# Patient Record
Sex: Male | Born: 1960
Health system: Western US, Academic
[De-identification: ages and names within clinical notes are randomized; demographics above are authoritative.]

---

## 2017-11-29 ENCOUNTER — Ambulatory Visit

## 2017-11-29 DIAGNOSIS — R002 Palpitations: Secondary | ICD-10-CM

## 2017-11-29 DIAGNOSIS — Z136 Encounter for screening for cardiovascular disorders: Secondary | ICD-10-CM

## 2017-11-29 NOTE — Progress Notes
OUTPATIENT INTERVENTIONAL CARDIOVASCULAR CONSULTATION NOTE   PATIENT: Daniel Montoya  MRN: [id number]  DOB: February 28, 1961  DATE OF SERVICE: 11/29/2017    REFERRING PRACTITIONER: No ref. provider found  PRIMARY CARE PROVIDER: No primary care provider on file.  REASON FOR CONSULT: palpitation    History of Present Illness:   Niguel Moure is a 57 y.o. male who I am seeing for  evaluation of palpitation.  - for last several weeks- has been having intermittent episodes of palpitation that gets worse throughout the day and he goes to bed like that and wakes up ok in the morining.- gets some SOB and dizziness.  - father died of MI  - denies any prior cardiac diz.  Coronary artery disease risk factors include: hypertension and male gender.   Patient denies exertional chest pressure/discomfort, paroxysmal nocturnal dyspnea, syncope and tachypnea.      Past Medical History:      No past medical history on file.  All were reviewed personally .  Past Surgical History:   No past surgical history on file.  All were reviewed personally .  Social History:     Social History     Social History   ??? Marital status: Married     Spouse name: N/A   ??? Number of children: N/A   ??? Years of education: N/A     Social History Main Topics   ??? Smoking status: Former Smoker   ??? Smokeless tobacco: Never Used   ??? Alcohol use None   ??? Drug use: Unknown   ??? Sexual activity: Not Asked     Other Topics Concern   ??? None     Social History Narrative   ??? None     All were reviewed personally .  Family History:   No family history on file.  All were reviewed personally .  Allergies:   Not on File  Home Medications:     No current outpatient prescriptions on file.     No current facility-administered medications for this visit.            Review of Systems:    14 points  comprehensive review of system  was performed and is negative except those mentioned in HPI.    Physical Exam:   VITALS: BP 142/83  ~ Pulse 77  ~ Ht 6' (1.829 m)  ~ Wt 184 lb 6.4 oz (83.6 kg)  ~ SpO2 98%  ~ BMI 25.01 kg/m???    Body mass index is 25.01 kg/m???.  General: alert, appears stated age and cooperative  Skin: no rashes  Eyes: conjunctivae/corneas clear. PERRL, EOM's intact.   HEET: atrauamtic with moist mucous membranes  Neck: Normal, JVP is not elevated  Respiratory: respiratory effort is normal, clear to auscultation bilaterally  Heart: regular rate and rhythm, S1, S2 normal, no murmur, click, rub or gallop  Abdomen: soft, non-tender; bowel sounds normal; no masses,  no organomegaly  Extremities: extremities normal, atraumatic, no cyanosis or edema  Pulses: 2+ and symmetric  Bruits: No carotid bruits  Neurological/Psychiatric: oriented x 3, normal mood and affect, normal judgment and insight    Laboratory Data:       No results found for: NA, K, CL, CO2, BUN, CREAT, CALCIUM, MG  No results found for: CHOL, CHOLHDL, CHOLDLCAL, CHOLDLQ, TRIGLY  @(LASTA1C)@ No results found for: WBC, HGB, HCT, MCV, PLT, INR, APTT  No results found for: AST, ALT, ALKPHOS, BILITOT, ALBUMIN  No results found for: BNP, TSH, T4AUTO, HGBA1C, HSCRP  No results found for: WBC, HGB, HCT, MCV, PLT  No results found for: NA, K, CL, CO2, BUN, CREAT, CALCIUM, MG  No results found for: AST, ALT, ALKPHOS, BILITOT, ALBUMIN  No results found for: CHOL, CHOLHDL, CHOLDLCAL, CHOLDLQ, TRIGLY  No results found for: PT, INR  No results found for: HGBA1C  No results found for: TSH, T4AUTO, T3AUTO  No results found for: BNP   No results found for: HSCRP    No components found for: HGA1C  @(LASTA1C)@  No components found for: HA1C    Cannot calculate ASCVD risk because patient met exclusionary criteria (HDL and Total Cholesterol) as of 4:13 PM on 11/29/2017  Values used to calculate ASCVD score:  Age: 57 y.o.   Gender: Male Race: Not African American.  Cannot calculate risk because HDL cholesterol not documented within the past 5 years.    Cannot calculate risk because Total cholesterol not documented within the past 5 years. Systolic BP: 142 mm Hg. BP was measured today.  The patient is not being treated with a medication that influences SBP.  The patient is currently not a smoker.  The patient does not have a diagnosis of diabetes.    Diagnostic/Imaging Studies:     ECG independently reviewed by me:  Normal Sinus rhythm Normal ECG    Echocardiogram independently reviewed by me: not done     Other tests:  none     Imaging  independently reviewed by me:        Assessment :   Zarian Colpitts is a 57 y.o. male with:    Palpitation/heart racing  Elevated BP       Plan:     - will get an echo to assess cardiac structure and valvular function.  - will get a Holter/Event monitor to assess for cardiac dysrrhythmia.  - advised the pt to start home BP monitoring and keeping a log.    I spent a total of 30 minutes face to face with the patient of which greater than 50% of that time was spent in counseling/coordination.  Topics of my discussion are in my note.          Current plan of care was discussed in detail with the patient and all questions were answered.     Lamarr Lulas MD    Assistant Clinical Professor   Interventional Cardiology  [employer] at [facility]    11/29/2017  4:13 PM

## 2017-12-12 ENCOUNTER — Telehealth

## 2017-12-12 NOTE — Telephone Encounter
Called l/m for patient to call back to schedule zio patch.

## 2017-12-21 ENCOUNTER — Ambulatory Visit: Payer: BLUE CROSS/BLUE SHIELD

## 2017-12-21 DIAGNOSIS — R002 Palpitations: Secondary | ICD-10-CM

## 2017-12-21 NOTE — Interdisciplinary
Patient will call to schedule Zio unable to do it at this time.

## 2017-12-21 NOTE — Progress Notes
OUTPATIENT INTERVENTIONAL CARDIOVASCULAR CONSULTATION NOTE   PATIENT: Daniel Montoya  MRN: [id number]  DOB: 01/30/1961  DATE OF SERVICE: 12/21/2017    REFERRING PRACTITIONER: No ref. provider found  PRIMARY CARE PROVIDER: No primary care provider on file.  REASON FOR CONSULT: palpitation    History of Present Illness:   Daniel Montoya is a 57 y.o. male who I am seeing for  evaluation of palpitation.  - for last several weeks- has been having intermittent episodes of palpitation that gets worse throughout the day and he goes to bed like that and wakes up ok in the morining.- gets some SOB and dizziness.  - father died of MI  - denies any prior cardiac diz.  Coronary artery disease risk factors include: hypertension and male gender.   Patient denies exertional chest pressure/discomfort, paroxysmal nocturnal dyspnea, syncope and tachypnea.    Interval history:  12/21/2017  - returned form vacation- did nothave major sx   - did not monitor BP at home    Past Medical History:      No past medical history on file.  All were reviewed personally .  Past Surgical History:   No past surgical history on file.  All were reviewed personally .  Social History:     Social History     Social History   ??? Marital status: Married     Spouse name: N/A   ??? Number of children: N/A   ??? Years of education: N/A     Social History Main Topics   ??? Smoking status: Former Smoker   ??? Smokeless tobacco: Never Used   ??? Alcohol use None   ??? Drug use: Unknown   ??? Sexual activity: Not Asked     Other Topics Concern   ??? None     Social History Narrative   ??? None     All were reviewed personally .  Family History:   No family history on file.  All were reviewed personally .  Allergies:   Not on File  Home Medications:     No current outpatient prescriptions on file.     No current facility-administered medications for this visit.            Review of Systems:    14 points  comprehensive review of system  was performed and is negative except those mentioned in HPI.    Physical Exam:   VITALS: BP 142/86  ~ Pulse 79  ~ Ht 6' (1.829 m)  ~ Wt 186 lb (84.4 kg)  ~ BMI 25.23 kg/m???    Body mass index is 25.23 kg/m???.  General: alert, appears stated age and cooperative  Skin: no rashes  Eyes: conjunctivae/corneas clear. PERRL, EOM's intact.   HEET: atrauamtic with moist mucous membranes  Neck: Normal, JVP is not elevated  Respiratory: respiratory effort is normal, clear to auscultation bilaterally  Heart: regular rate and rhythm, S1, S2 normal, no murmur, click, rub or gallop  Abdomen: soft, non-tender; bowel sounds normal; no masses,  no organomegaly  Extremities: extremities normal, atraumatic, no cyanosis or edema  Pulses: 2+ and symmetric  Bruits: No carotid bruits  Neurological/Psychiatric: oriented x 3, normal mood and affect, normal judgment and insight    Laboratory Data:       No results found for: NA, K, CL, CO2, BUN, CREAT, CALCIUM, MG  No results found for: CHOL, CHOLHDL, CHOLDLCAL, CHOLDLQ, TRIGLY  @(LASTA1C)@ No results found for: WBC, HGB, HCT, MCV, PLT, INR, APTT  No results  found for: AST, ALT, ALKPHOS, BILITOT, ALBUMIN  No results found for: BNP, TSH, T4AUTO, HGBA1C, HSCRP     No results found for: WBC, HGB, HCT, MCV, PLT  No results found for: NA, K, CL, CO2, BUN, CREAT, CALCIUM, MG  No results found for: AST, ALT, ALKPHOS, BILITOT, ALBUMIN  No results found for: CHOL, CHOLHDL, CHOLDLCAL, CHOLDLQ, TRIGLY  No results found for: PT, INR  No results found for: HGBA1C  No results found for: TSH, T4AUTO, T3AUTO  No results found for: BNP   No results found for: HSCRP    No components found for: HGA1C  @(LASTA1C)@  No components found for: HA1C    Cannot calculate ASCVD risk because patient met exclusionary criteria (HDL and Total Cholesterol) as of 9:03 AM on 12/21/2017  Values used to calculate ASCVD score:  Age: 57 y.o.   Gender: Male Race: Not African American.  Cannot calculate risk because HDL cholesterol not documented within the past 5 years.    Cannot calculate risk because Total cholesterol not documented within the past 5 years.    Systolic BP: 142 mm Hg. BP was measured today.  The patient is not being treated with a medication that influences SBP.  The patient is currently not a smoker.  The patient does not have a diagnosis of diabetes.    Diagnostic/Imaging Studies:     ECG independently reviewed by me:  Normal Sinus rhythm Normal ECG    Echocardiogram independently reviewed by me: normal     Other tests:  none     Imaging  independently reviewed by me:        Assessment :   Daniel Montoya is a 57 y.o. male with:    Palpitation/heart racing  Elevated BP       Plan:     Echo findings were discussed- pt was reassured that has structurally normal heart.  - will get a Holter/Event monitor to assess for cardiac dysrrhythmia.  - advised the pt to start home BP monitoring and keeping a log.    I spent a total of 10 minutes face to face with the patient of which greater than 50% of that time was spent in counseling/coordination.  Topics of my discussion are in my note.          Current plan of care was discussed in detail with the patient and all questions were answered.     Lamarr Lulas MD    Assistant Clinical Professor   Interventional Cardiology  [employer] at [facility]    12/21/2017  9:03 AM

## 2018-05-02 ENCOUNTER — Telehealth: Payer: BLUE CROSS/BLUE SHIELD

## 2018-05-02 ENCOUNTER — Ambulatory Visit: Payer: BLUE CROSS/BLUE SHIELD

## 2018-05-02 ENCOUNTER — Institutional Professional Consult (permissible substitution): Payer: BLUE CROSS/BLUE SHIELD

## 2018-05-02 DIAGNOSIS — R000 Tachycardia, unspecified: Secondary | ICD-10-CM

## 2018-05-02 DIAGNOSIS — R002 Palpitations: Secondary | ICD-10-CM

## 2018-05-02 NOTE — Interdisciplinary
It has been explained to the patient that they may receive a bill for the portion they are responsible for if their insurance does not cover the entire cost of the patch. It has been seen that the average coverage for the zio patch is around 80%, leaving the remaining balance between $50-$80 as patients responsibility. This is just an estimated value and patient has been informed that by calling zio patch directly at [phone], they would be able to get a more accurate estimate depending on their insurance coverage.  Patient has agreed to continue with zio patch placement.  Zio Patch applied per Dr. Patricia Pesa Orders for 14 days. Skin prepped and cleaned thoroughly. Device applied and pressed firmly across the entire device for 2 minutes. Zio button pressed and light is flashing green. Patient made aware that if device falls off, please call customer service [phone]. Patient made aware that they may take a shower, but keep it short and keep soap and lotion away. Please do not submerge device in water, such as bath or swimming. Patient made aware to please press button if experiencing any symptoms and please document in the diary the time, date, what they felt, duration, and activity performed while felt. Explained to patient at the end of the 14 days, to please remove device and place in pre-paid box and send to via USPS or and (blue) mailbox. Patient made aware of the option to download the application for journal keeping of cardiac events. Patient made to allow 1-2 weeks for the company to receive and download and please F/U with provider.   Device #: [id number]  Date Placed: 05/02/2018  Time Placed: 10:32 AM

## 2018-05-02 NOTE — Progress Notes
OUTPATIENT INTERVENTIONAL CARDIOVASCULAR CONSULTATION NOTE   PATIENT: Daniel Montoya  MRN: [id number]  DOB: [birth year]  DATE OF SERVICE: 05/02/2018    REFERRING PRACTITIONER: Lamarr Lulas, MD, M*  PRIMARY CARE PROVIDER: Merlyn Lot, MD  REASON FOR CONSULT: palpitation    History of Present Illness:   Daniel Montoya is a 58 y.o. male who I am seeing for  evaluation of palpitation.  - for last several weeks- has been having intermittent episodes of palpitation that gets worse throughout the day and he goes to bed like that and wakes up ok in the morining.- gets some SOB and dizziness.  - father died of MI  - denies any prior cardiac diz.  Coronary artery disease risk factors include: hypertension and male gender.   Patient denies exertional chest pressure/discomfort, paroxysmal nocturnal dyspnea, syncope and tachypnea.    Interval history:  05/02/2018   - has been having postural dizziness and heart is racing  - denies any exertional sx  - under stress- job related  10/4  - returned form vacation- did nothave major sx   - did not monitor BP at home    Past Medical History:      History reviewed. No pertinent past medical history.  All were reviewed personally .  Past Surgical History:   History reviewed. No pertinent surgical history.  All were reviewed personally .  Social History:     Social History     Socioeconomic History   ??? Marital status: Married     Spouse name: Not on file   ??? Number of children: Not on file   ??? Years of education: Not on file   ??? Highest education level: Not on file   Occupational History   ??? Not on file   Social Needs   ??? Financial resource strain: Not on file   ??? Food insecurity:     Worry: Not on file     Inability: Not on file   ??? Transportation needs:     Medical: Not on file     Non-medical: Not on file   Tobacco Use   ??? Smoking status: Former Smoker   ??? Smokeless tobacco: Never Used   Substance and Sexual Activity   ??? Alcohol use: Not on file   ??? Drug use: Not on file ??? Sexual activity: Not on file   Lifestyle   ??? Physical activity:     Days per week: Not on file     Minutes per session: Not on file   ??? Stress: Not on file   Relationships   ??? Social connections:     Talks on phone: Not on file     Gets together: Not on file     Attends religious service: Not on file     Active member of club or organization: Not on file     Attends meetings of clubs or organizations: Not on file     Relationship status: Not on file   Other Topics Concern   ??? Not on file   Social History Narrative   ??? Not on file     All were reviewed personally .  Family History:   History reviewed. No pertinent family history.  All were reviewed personally .  Allergies:   Not on File  Home Medications:     No current outpatient medications on file.     No current facility-administered medications for this visit.  Review of Systems:    14 points  comprehensive review of system  was performed and is negative except those mentioned in HPI.    Physical Exam:   VITALS: BP 110/69 (BP Location: Left arm, Patient Position: Standing, Cuff Size: Regular)  ~ Pulse 89  ~ Ht 6' (1.829 m)  ~ Wt 192 lb 12.8 oz (87.5 kg)  ~ BMI 26.15 kg/m???    Body mass index is 26.15 kg/m???.  General: alert, appears stated age and cooperative  Skin: no rashes  Eyes: conjunctivae/corneas clear. PERRL, EOM's intact.   HEET: atrauamtic with moist mucous membranes  Neck: Normal, JVP is not elevated  Respiratory: respiratory effort is normal, clear to auscultation bilaterally  Heart: regular rate and rhythm, S1, S2 normal, no murmur, click, rub or gallop  Abdomen: soft, non-tender; bowel sounds normal; no masses,  no organomegaly  Extremities: extremities normal, atraumatic, no cyanosis or edema  Pulses: 2+ and symmetric  Bruits: No carotid bruits  Neurological/Psychiatric: oriented x 3, normal mood and affect, normal judgment and insight    Laboratory Data:       No results found for: NA, K, CL, CO2, BUN, CREAT, CALCIUM, MG No results found for: CHOL, CHOLHDL, CHOLDLCAL, CHOLDLQ, TRIGLY  @(LASTA1C)@ No results found for: WBC, HGB, HCT, MCV, PLT, INR, APTT  No results found for: AST, ALT, ALKPHOS, BILITOT, ALBUMIN  No results found for: BNP, TSH, T4AUTO, HGBA1C, HSCRP     No results found for: WBC, HGB, HCT, MCV, PLT  No results found for: NA, K, CL, CO2, BUN, CREAT, CALCIUM, MG  No results found for: AST, ALT, ALKPHOS, BILITOT, ALBUMIN  No results found for: CHOL, CHOLHDL, CHOLDLCAL, CHOLDLQ, TRIGLY  No results found for: PT, INR  No results found for: HGBA1C  No results found for: TSH, T4AUTO, T3AUTO  No results found for: BNP   No results found for: HSCRP    No components found for: HGA1C  @(LASTA1C)@  No components found for: HA1C    Cannot calculate ASCVD risk because patient met exclusionary criteria (HDL and Total Cholesterol) as of 10:13 AM on 05/02/2018  Values used to calculate ASCVD score:  Age: 58 y.o.   Gender: Male Race: Not African American.  Cannot calculate risk because HDL cholesterol not documented within the past 5 years.    Cannot calculate risk because Total cholesterol not documented within the past 5 years.    Systolic BP: 110 mm Hg. BP was measured today.  The patient is not being treated with a medication that influences SBP.  The patient is currently not a smoker.  The patient does not have a diagnosis of diabetes.    Diagnostic/Imaging Studies:     ECG independently reviewed by me:  Normal Sinus rhythm Normal ECG    Echocardiogram independently reviewed by me: normal     Other tests:  none     Imaging  independently reviewed by me:        Assessment :   Daniel Montoya is a 58 y.o. male with:    Palpitation/heart racing  Postural dizziness  Elevated BP       Plan:     - will get a Holter/Event monitor to assess for cardiac dysrrhythmia.  - advised the pt to start home BP monitoring and keeping a log.  Again Echo findings were discussed- pt was reassured that has structurally normal heart. I spent a total of 15  minutes face to face with the patient of which greater than  50% of that time was spent in counseling/coordination.  Topics of my discussion are in my note.          Current plan of care was discussed in detail with the patient and all questions were answered.     Lamarr Lulas MD    Assistant Clinical Professor   Interventional Cardiology  [employer] at [facility]    05/02/2018  10:13 AM

## 2018-05-02 NOTE — Telephone Encounter
Message was relayed to [facility] and instructed patient to come in for his visit earlier and will be seen as soon as he arrives. Patient stated he was stable enough and agreed to come in.

## 2018-05-02 NOTE — Telephone Encounter
Patient is coming in for an appointment with Dr. Patricia Pesa she he has fast heartbeats.

## 2018-05-02 NOTE — Telephone Encounter
  Use only for those patients who:    ; Are not eligible for Nurse Triage [x]                               AND/OR  ; Refuse Nurse Triage Assistance []    Is patient stating this is a medical emergency?  []   Yes   []   No  ; If yes, instruct patient to call 911 [x]    MD/Provider: Dr Patricia Pesa  Symptom: Racing Heart and dizziness  Onset: Yes  Reason for call: Patient states he has racing Heart  and is currently dizzy.  Pain Level (Scale 0 to 10; 0 = No pain to 10 = Worst pain ever):  0  Is patient currently receiving treatment by a specialist for this symptom? [x]   Yes   []   No  If yes, please send call to specialty office []    Call warm transferred to PDL/clinic   [x]   Yes   []   No  Call Received by Practice Representative: Marine

## 2018-05-23 ENCOUNTER — Ambulatory Visit: Payer: BLUE CROSS/BLUE SHIELD

## 2018-05-23 DIAGNOSIS — F5221 Male erectile disorder: Secondary | ICD-10-CM

## 2018-05-23 DIAGNOSIS — R002 Palpitations: Secondary | ICD-10-CM

## 2018-05-24 MED ORDER — SILDENAFIL CITRATE 100 MG PO TABS
100 mg | ORAL_TABLET | Freq: Every day | ORAL | 1 refills | Status: AC | PRN
Start: 2018-05-24 — End: ?

## 2018-05-24 MED ORDER — PROPRANOLOL HCL 20 MG PO TABS
20 mg | ORAL_TABLET | ORAL | 1 refills | Status: AC | PRN
Start: 2018-05-24 — End: ?

## 2018-05-24 NOTE — Progress Notes
OUTPATIENT INTERVENTIONAL CARDIOVASCULAR CONSULTATION NOTE   PATIENT: Daniel Montoya  MRN: [id number]  DOB: December 09, 1960  DATE OF SERVICE: 05/23/2018    REFERRING PRACTITIONER: No ref. provider found  PRIMARY CARE PROVIDER: Merlyn Lot, MD  REASON FOR CONSULT: palpitation    History of Present Illness:   Daniel Montoya is a 58 y.o. male who I am seeing for  evaluation of palpitation.  - for last several weeks- has been having intermittent episodes of palpitation that gets worse throughout the day and he goes to bed like that and wakes up ok in the morining.- gets some SOB and dizziness.  - father died of MI  - denies any prior cardiac diz.  Coronary artery disease risk factors include: hypertension and male gender.   Patient denies exertional chest pressure/discomfort, paroxysmal nocturnal dyspnea, syncope and tachypnea.    Interval history:  05/23/2018   - here for fu  - nothing significant on Zio  - has been doing ok   - has been having ED problems  2/13  has been having postural dizziness and heart is racing  - denies any exertional sx  - under stress- job related  10/4  - returned form vacation- did nothave major sx   - did not monitor BP at home    Past Medical History:      History reviewed. No pertinent past medical history.  All were reviewed personally .  Past Surgical History:   History reviewed. No pertinent surgical history.  All were reviewed personally .  Social History:     Social History     Socioeconomic History   ??? Marital status: Married     Spouse name: Not on file   ??? Number of children: Not on file   ??? Years of education: Not on file   ??? Highest education level: Not on file   Occupational History   ??? Not on file   Social Needs   ??? Financial resource strain: Not on file   ??? Food insecurity:     Worry: Not on file     Inability: Not on file   ??? Transportation needs:     Medical: Not on file     Non-medical: Not on file   Tobacco Use   ??? Smoking status: Former Smoker   ??? Smokeless tobacco: Never Used Substance and Sexual Activity   ??? Alcohol use: Not on file   ??? Drug use: Not on file   ??? Sexual activity: Not on file   Lifestyle   ??? Physical activity:     Days per week: Not on file     Minutes per session: Not on file   ??? Stress: Not on file   Relationships   ??? Social connections:     Talks on phone: Not on file     Gets together: Not on file     Attends religious service: Not on file     Active member of club or organization: Not on file     Attends meetings of clubs or organizations: Not on file     Relationship status: Not on file   Other Topics Concern   ??? Not on file   Social History Narrative   ??? Not on file     All were reviewed personally .  Family History:   History reviewed. No pertinent family history.  All were reviewed personally .  Allergies:   No Known Allergies  Home Medications:     No  current outpatient medications on file.     No current facility-administered medications for this visit.            Review of Systems:    14 points  comprehensive review of system  was performed and is negative except those mentioned in HPI.    Physical Exam:   VITALS: BP 130/79  ~ Pulse 72  ~ Ht 6' (1.829 m)  ~ Wt 195 lb 12.8 oz (88.8 kg)  ~ BMI 26.56 kg/m???    Body mass index is 26.56 kg/m???.  General: alert, appears stated age and cooperative  Skin: no rashes  Eyes: conjunctivae/corneas clear. PERRL, EOM's intact.   HEET: atrauamtic with moist mucous membranes  Neck: Normal, JVP is not elevated  Respiratory: respiratory effort is normal, clear to auscultation bilaterally  Heart: regular rate and rhythm, S1, S2 normal, no murmur, click, rub or gallop  Abdomen: soft, non-tender; bowel sounds normal; no masses,  no organomegaly  Extremities: extremities normal, atraumatic, no cyanosis or edema  Pulses: 2+ and symmetric  Bruits: No carotid bruits  Neurological/Psychiatric: oriented x 3, normal mood and affect, normal judgment and insight    Laboratory Data: No results found for: NA, K, CL, CO2, BUN, CREAT, CALCIUM, MG  No results found for: CHOL, CHOLHDL, CHOLDLCAL, CHOLDLQ, TRIGLY  @(LASTA1C)@ No results found for: WBC, HGB, HCT, MCV, PLT, INR, APTT  No results found for: AST, ALT, ALKPHOS, BILITOT, ALBUMIN  No results found for: BNP, TSH, T4AUTO, HGBA1C, HSCRP     No results found for: WBC, HGB, HCT, MCV, PLT  No results found for: NA, K, CL, CO2, BUN, CREAT, CALCIUM, MG  No results found for: AST, ALT, ALKPHOS, BILITOT, ALBUMIN  No results found for: CHOL, CHOLHDL, CHOLDLCAL, CHOLDLQ, TRIGLY  No results found for: PT, INR  No results found for: HGBA1C  No results found for: TSH, T4AUTO, T3AUTO  No results found for: BNP   No results found for: HSCRP    No components found for: HGA1C  @(LASTA1C)@  No components found for: HA1C    Cannot calculate ASCVD risk because patient met exclusionary criteria (HDL and Total Cholesterol) as of 4:53 PM on 05/23/2018  Values used to calculate ASCVD score:  Age: 58 y.o.   Gender: Male Race: Not African American.  Cannot calculate risk because HDL cholesterol not documented within the past 5 years.    Cannot calculate risk because Total cholesterol not documented within the past 5 years.    Systolic BP: 130 mm Hg. BP was measured today.  The patient is not being treated with a medication that influences SBP.  The patient is currently not a smoker.  The patient does not have a diagnosis of diabetes.    Diagnostic/Imaging Studies:     ECG independently reviewed by me:  Normal Sinus rhythm Normal ECG    Echocardiogram independently reviewed by me: normal     Other tests:  none     Imaging  independently reviewed by me:        Assessment :   Daniel Montoya is a 58 y.o. male with:    Palpitation/heart racing- no major sx on zio  Postural dizziness  Elevated BP  ED       Plan:     - zio patch was discussed- pt was reassured- I adivsed him to take Propranolol as needed for palpitation The patient desires Viagra to treat his erectile dysfunction. History and physical exam has not disclosed any obvious treatable cause  of this complaint. He is informed that Viagra is sometimes not covered by insurance. It is available on a fee-for-service cost basis, and is relatively expensive. He can start with 50 mg dose, and increase to 100 mg if necessary. The method of use 1 hour prior to anticipated intercourse is explained. He should not use any more than one tablet in a 24 hour period. The side effects of possible headache, flushing, dyspepsia and transient changes in vision have been explained.    The patient is not taking nitrates, and denies he has access to nitrates in any form at any time. I have counseled him that taking Viagra with nitrates of any form can cause death. Additionally, Viagra serum concentrations can be increased by the following: cimetidine, erythromycin, itraconazole or ketoconazole. This patient does not take these drugs, but I have counseled him to avoid Viagra if he does take any of these.    We have also discussed the fact that there have been some deaths in patients after taking Viagra, felt due to the exertion of intercourse rather than the drug itself. The patient is aware of this, and accepts whatever unknown degree of risk there is in this aspect.  discussed preventive cardiology- at this point we will continue aggressive risk factor modification.      I spent a total of 15  minutes face to face with the patient of which greater than 50% of that time was spent in counseling/coordination.  Topics of my discussion are in my note.          Current plan of care was discussed in detail with the patient and all questions were answered.     Lamarr Lulas MD    Assistant Clinical Professor   Interventional Cardiology  [employer] [facility]    05/23/2018  4:53 PM

## 2019-06-25 ENCOUNTER — Ambulatory Visit: Payer: BLUE CROSS/BLUE SHIELD

## 2019-06-25 DIAGNOSIS — Z23 Encounter for immunization: Secondary | ICD-10-CM

## 2022-09-27 IMAGING — MR MRI CERVICAL SPINE WITHOUT CONTRAST
5 of 7 series · 21 of 48 positions shown · IV contrast (gadolinium)
Comparison: None

________________________________________________________________________________________________ 
MRI CERVICAL SPINE WITHOUT CONTRAST, 09/27/2022 [DATE]: 
CLINICAL INDICATION: Chronic neck pain radiates to left shoulder. Stiffness. 
Limited range of motion. Radiculopathy, Cervical Region
TECHNIQUE: Multiplanar, multiecho position MR images of the cervical spine were 
performed without intravenous gadolinium enhancement. Patient was scanned on a 
1.5T magnet.

[Series 101: survey · axial · 10.0mm · 1.25mm/px · z∈[-6,+200]mm · 3 of 10 slices shown]
[im 1/10]
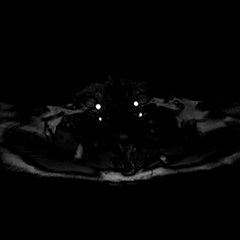
[im 5/10]
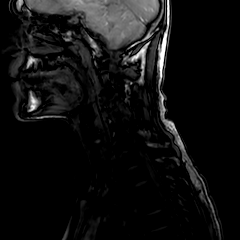
[im 10/10]
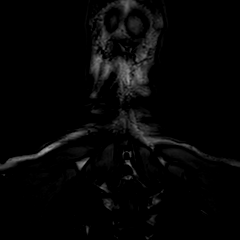

[Series 201: t2w_cor-surv · coronal · 5.0mm · 0.69mm/px · 1 of 5 slices shown]
[im 1/5]
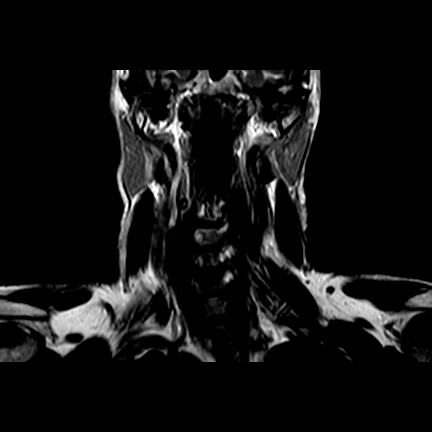

[Series 301: T1 · sagittal · 3.0mm · 0.39mm/px · 6 of 15 slices shown]
[im 1/15]
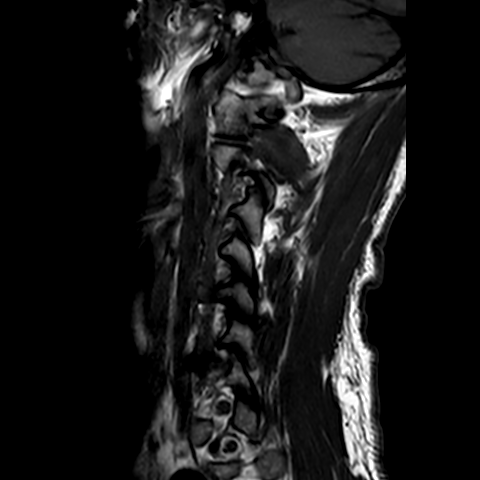
[im 3/15]
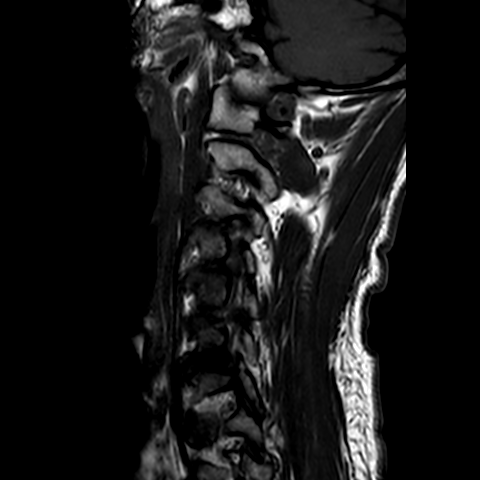
[im 6/15]
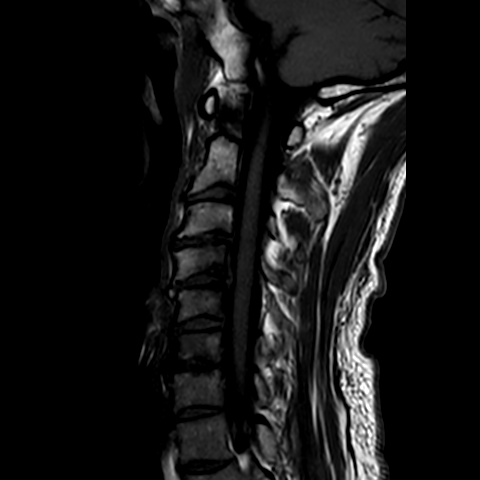
[im 9/15]
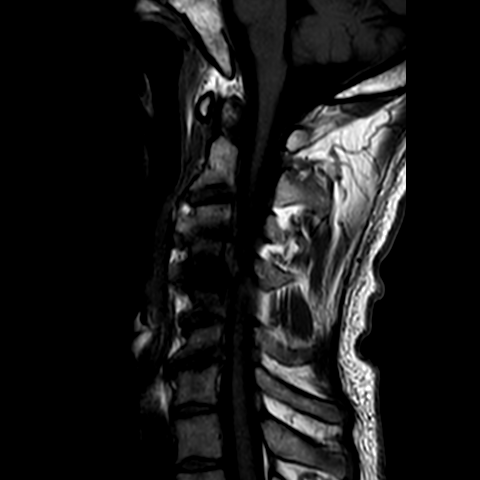
[im 12/15]
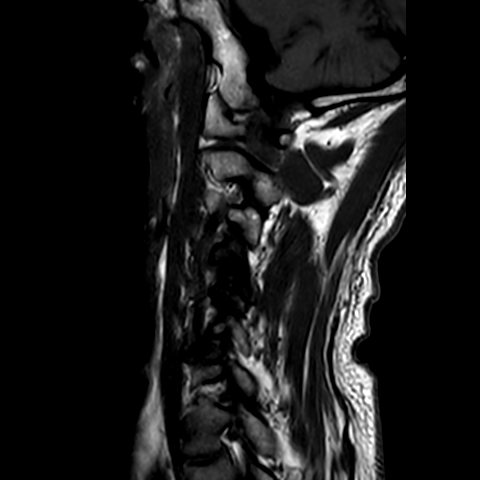
[im 15/15]
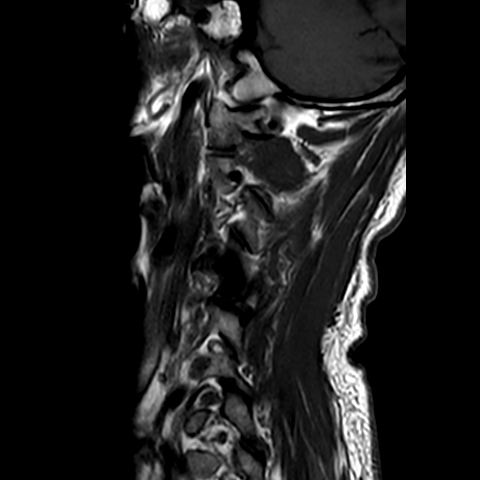

[Series 402: (id)_mdixon_tse · sagittal · 3.0mm · 0.35mm/px · 2 of 15 slices shown]
[im 1/15]
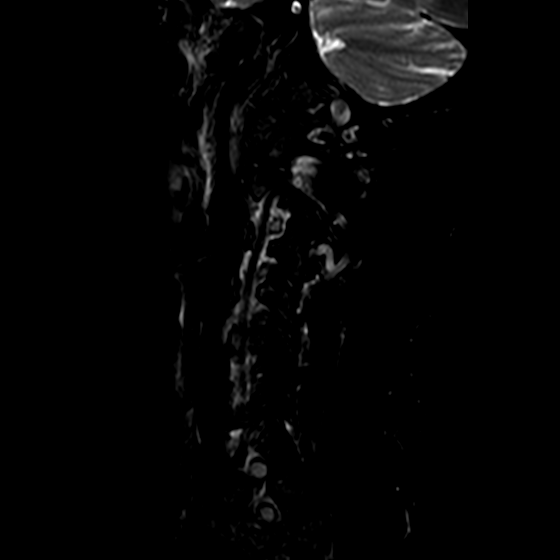
[im 3/15]
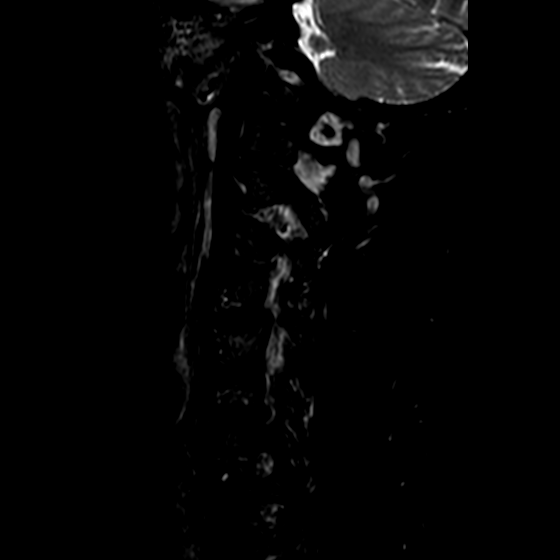

[Series 601: T2 · axial · 3.0mm · 0.31mm/px · z∈[-44,+56]mm · 9 of 34 slices shown]
[im 1/34]
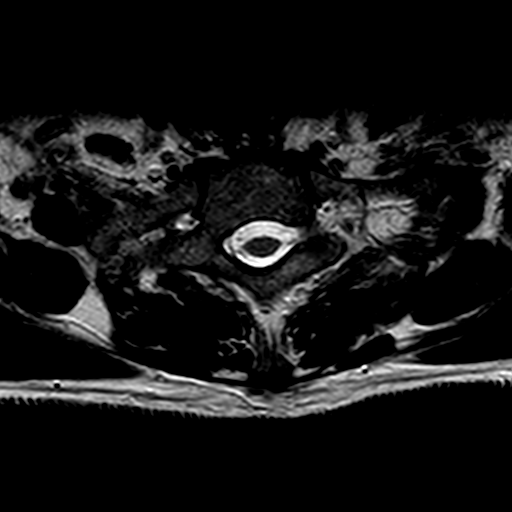
[im 6/34]
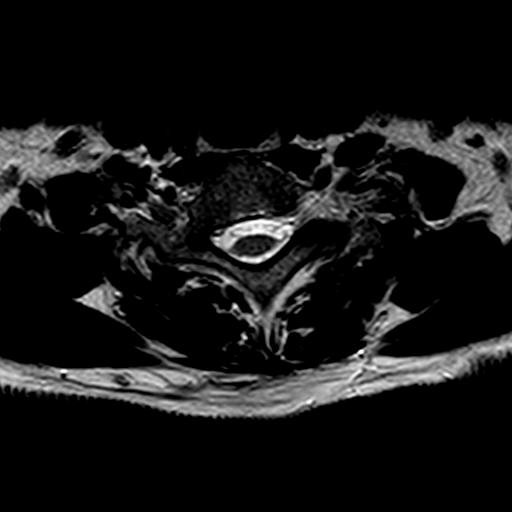
[im 12/34]
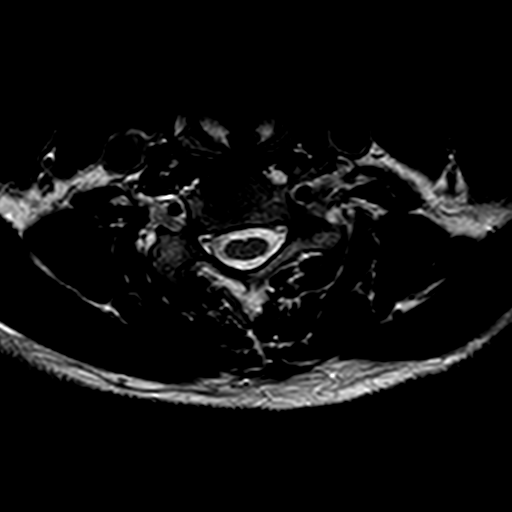
[im 14/34]
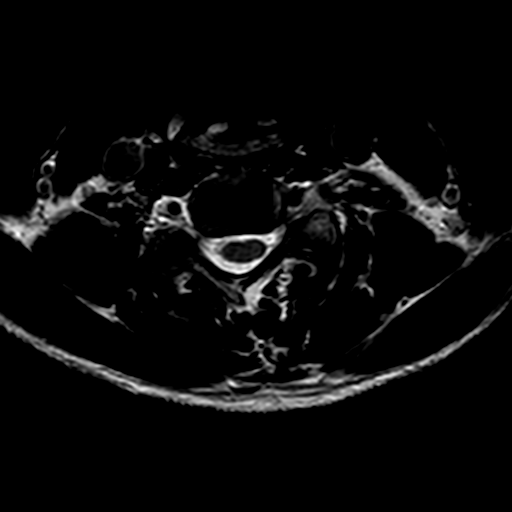
[im 17/34]
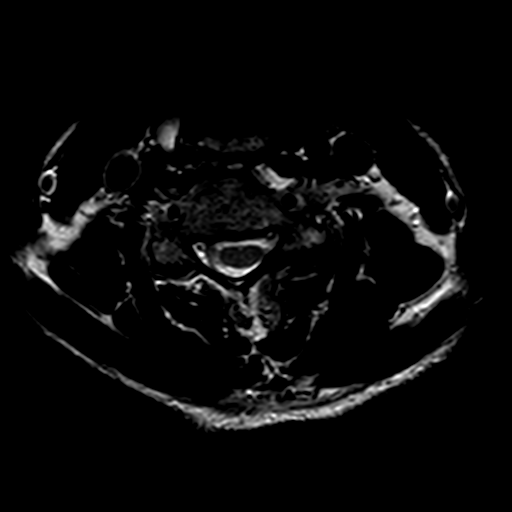
[im 20/34]
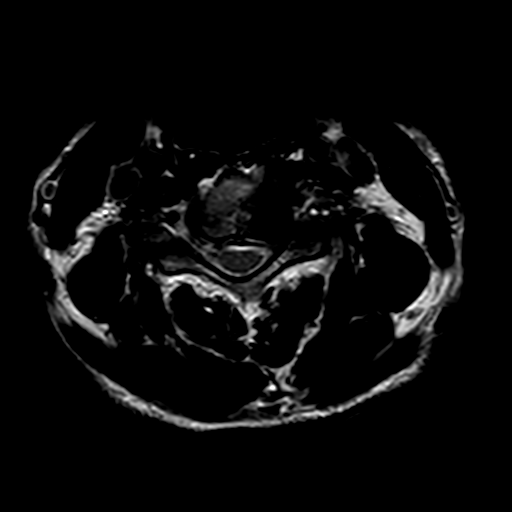
[im 23/34]
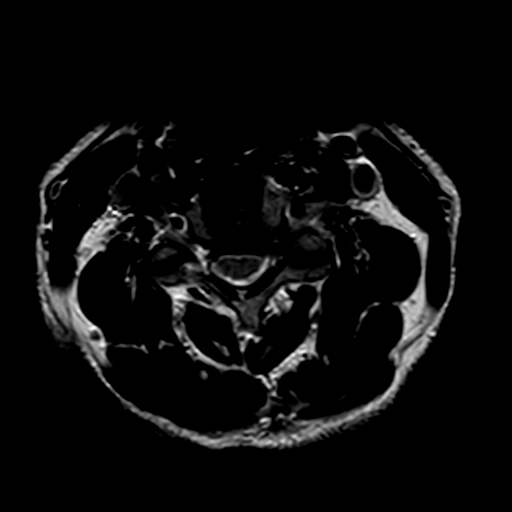
[im 28/34]
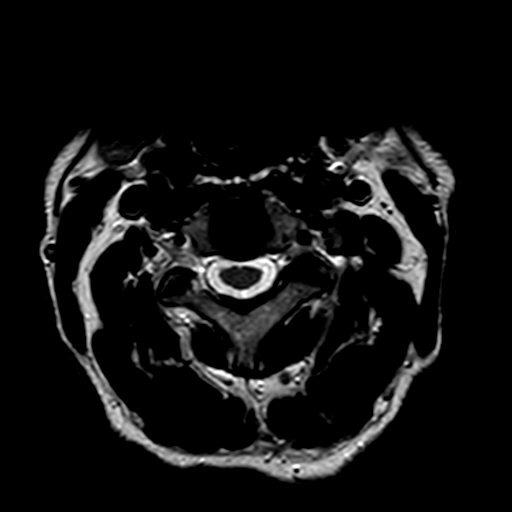
[im 34/34]
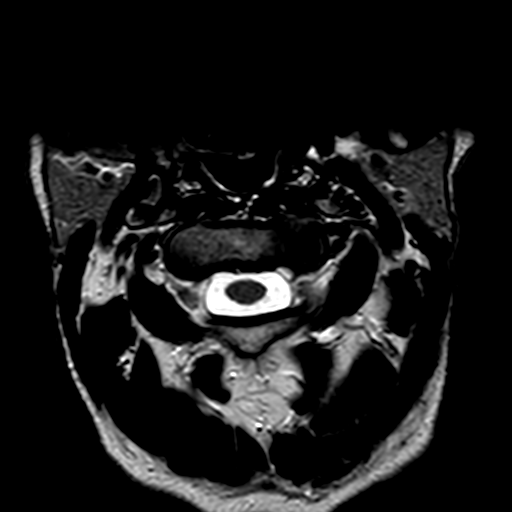

[21 of 48 positions shown; findings below may reference images not displayed]

FINDINGS: -------------------------------------------------------------------------------- 
----------------- 
GENERAL: 
ALIGNMENT: Mild dextroconvex cervical scoliosis. Grade 1 retrolisthesis C3 on C4 
and C4 on C5 and C6 on C7. 
VERTEBRAL BODY HEIGHT: Normal.  
MARROW SIGNAL: No focal suspect signal abnormality. There is reactive, type I 
Modic endplate signal change C4-C5. Similar findings are present within the left 
C4-C5 articulating facet. 
CORD SIGNAL: Normal.  
ADDITIONAL FINDINGS: None. 
-------------------------------------------------------------------------------- 
---------------- 
SEGMENTAL: 
CRANIOCERVICAL JUNCTION: No significant stenosis. 
C2-C3: Normal disc height. Minimal disc osteophyte complex. Canal and foramina 
are patent. 
C3-C4: Mild loss of disc height. Disc osteophyte complex abuts and flattens the 
ventral cord margin with mild canal stenosis. Right foramen patent. Prominent 
left-sided uncinate spurring results in moderate to severe left foraminal 
narrowing. Correlate for left C4 nerve root involvement. 
C4-C5: Mild loss of disc height with generalized disc osteophyte complex 
indenting the ventral cord margin, with ventral cord distortion. Mild canal 
stenosis. Bilateral uncinate spurring is more prominent on the left. Mild right 
and moderate to severe left foraminal narrowing. Could affect left C5 nerve 
root. 
C5-C6: Normal disc height. Canal patent. Mild to moderate bilateral foraminal 
narrowing. Right and left facet arthropathy. 
C6-C7: Mild loss of disc height. Generalized disc osteophyte complex. 
Significant right-sided and left-sided uncinate spurring with severe bilateral 
foraminal narrowing may affect the exiting C7 nerve roots. 
C7-T1: Normal disc height. No herniation. Normal facets. No spinal canal or 
neural foraminal stenosis. 
Visualized upper thoracic segment unremarkable. 
-------------------------------------------------------------------------------- 
---------------
IMPRESSION: Multilevel foraminal narrowing that could affect left C4 nerve root, left C5 
nerve root, or bilateral C7 nerve roots. 
No critical or significant central canal narrowing, although there is ventral 
cord distortion C3-C4, C4-C5 levels. No abnormal cord signal. 
Reactive type edema C4-C5 level and involves the left C4-C5 articulating facet. 
This could be a pain generator.
